# Patient Record
Sex: Male | Born: 1955 | Race: White | Hispanic: No | Marital: Married
Health system: Southern US, Community
[De-identification: ages and names within clinical notes are randomized; demographics above are authoritative.]

## PROBLEM LIST (undated history)

## (undated) DIAGNOSIS — E119 Type 2 diabetes mellitus without complications: Secondary | ICD-10-CM

## (undated) DIAGNOSIS — E785 Hyperlipidemia, unspecified: Secondary | ICD-10-CM

## (undated) DIAGNOSIS — D649 Anemia, unspecified: Secondary | ICD-10-CM

## (undated) DIAGNOSIS — N2581 Secondary hyperparathyroidism of renal origin: Secondary | ICD-10-CM

## (undated) DIAGNOSIS — I10 Essential (primary) hypertension: Secondary | ICD-10-CM

## (undated) DIAGNOSIS — G4733 Obstructive sleep apnea (adult) (pediatric): Secondary | ICD-10-CM

## (undated) DIAGNOSIS — N189 Chronic kidney disease, unspecified: Secondary | ICD-10-CM

## (undated) HISTORY — DX: Anemia, unspecified: D64.9

## (undated) HISTORY — DX: Obstructive sleep apnea (adult) (pediatric): G47.33

## (undated) HISTORY — DX: Essential (primary) hypertension: I10

## (undated) HISTORY — DX: Type 2 diabetes mellitus without complications: E11.9

## (undated) HISTORY — PX: ANKLE SURGERY: SHX546

## (undated) HISTORY — DX: Chronic kidney disease, unspecified: N18.9

## (undated) HISTORY — PX: CATARACT EXTRACTION: SUR2

## (undated) HISTORY — DX: Secondary hyperparathyroidism of renal origin: N25.81

## (undated) HISTORY — DX: Hyperlipidemia, unspecified: E78.5

## (undated) HISTORY — PX: EYE SURGERY: SHX253

---

## 2008-03-05 HISTORY — PX: TIBIA FRACTURE SURGERY: SHX806

## 2015-04-09 ENCOUNTER — Other Ambulatory Visit: Payer: Self-pay

## 2015-04-09 ENCOUNTER — Encounter: Payer: Self-pay | Admitting: Vascular Surgery

## 2015-04-09 DIAGNOSIS — N184 Chronic kidney disease, stage 4 (severe): Secondary | ICD-10-CM

## 2015-04-09 DIAGNOSIS — Z01810 Encounter for preprocedural cardiovascular examination: Secondary | ICD-10-CM

## 2015-04-16 ENCOUNTER — Inpatient Hospital Stay (HOSPITAL_COMMUNITY)
Admission: RE | Admit: 2015-04-16 | Discharge: 2015-04-16 | Disposition: A | Discharge status: Home / Self Care | Payer: 59 | Source: Ambulatory Visit

## 2015-04-16 ENCOUNTER — Ambulatory Visit: Payer: 59 | Admitting: Vascular Surgery

## 2015-04-16 DIAGNOSIS — N184 Chronic kidney disease, stage 4 (severe): Secondary | ICD-10-CM

## 2015-04-16 DIAGNOSIS — Z01810 Encounter for preprocedural cardiovascular examination: Secondary | ICD-10-CM

## 2015-10-25 ENCOUNTER — Other Ambulatory Visit: Payer: Self-pay | Admitting: Physical Medicine and Rehabilitation

## 2015-10-25 DIAGNOSIS — M5416 Radiculopathy, lumbar region: Secondary | ICD-10-CM

## 2015-10-28 ENCOUNTER — Other Ambulatory Visit: Payer: Self-pay | Admitting: Physical Medicine and Rehabilitation

## 2015-10-28 ENCOUNTER — Ambulatory Visit
Admission: RE | Admit: 2015-10-28 | Discharge: 2015-10-28 | Disposition: A | Discharge status: Home / Self Care | Payer: Medicare Other | Source: Ambulatory Visit | Attending: Physical Medicine and Rehabilitation | Admitting: Physical Medicine and Rehabilitation

## 2015-10-28 DIAGNOSIS — M5416 Radiculopathy, lumbar region: Secondary | ICD-10-CM

## 2015-10-28 IMAGING — XA DG EPIDURAL/NERVE ROOT
4 series · 4 of 4 positions shown · non-contrast
Comparison: none

CLINICAL DATA: Bilateral L5 pars defects. Low back pain. The
patient states the majority of his pain is in the upper back.
Occasional radicular symptoms.

[Series 1: ortho standard · 1 of 1 slices shown (1 of 4)]
[im 1/1]
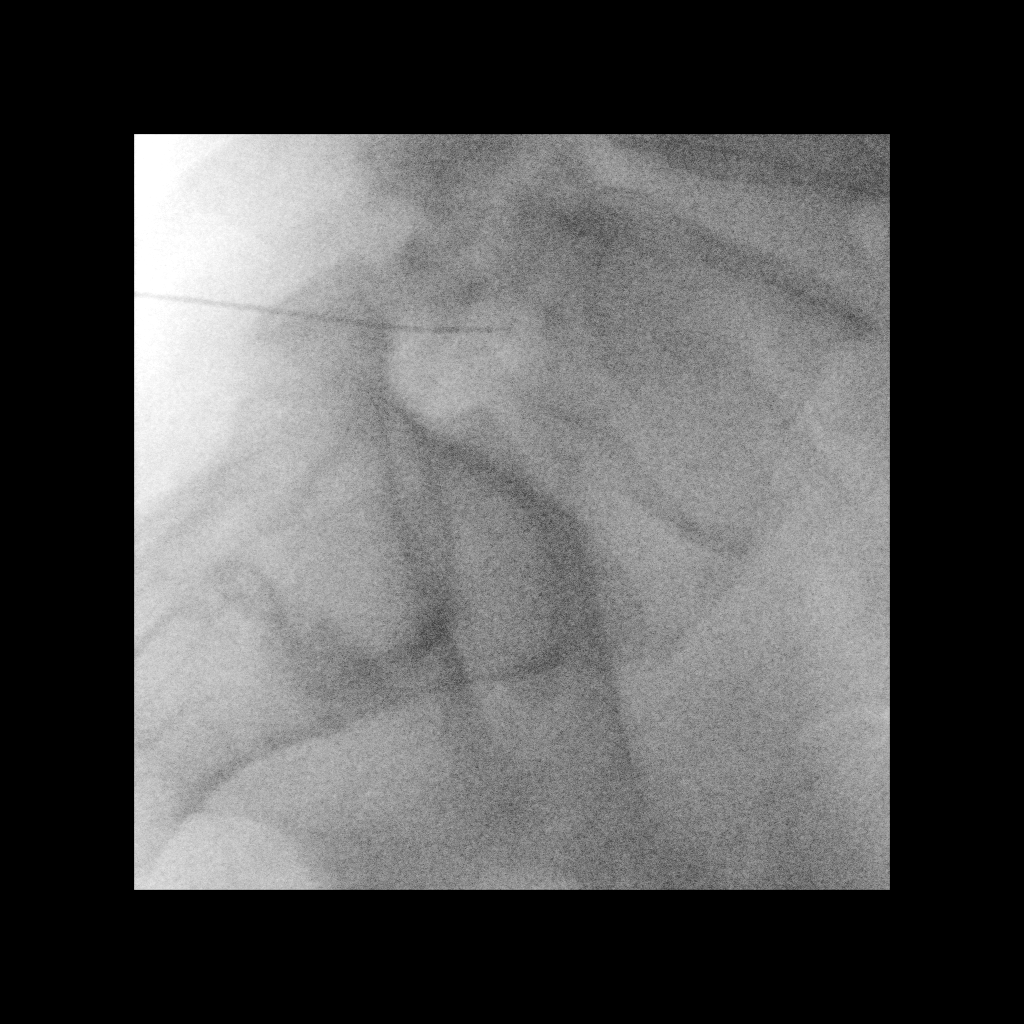

[Series 2: ortho standard · 1 of 1 slices shown (2 of 4)]
[im 1/1]
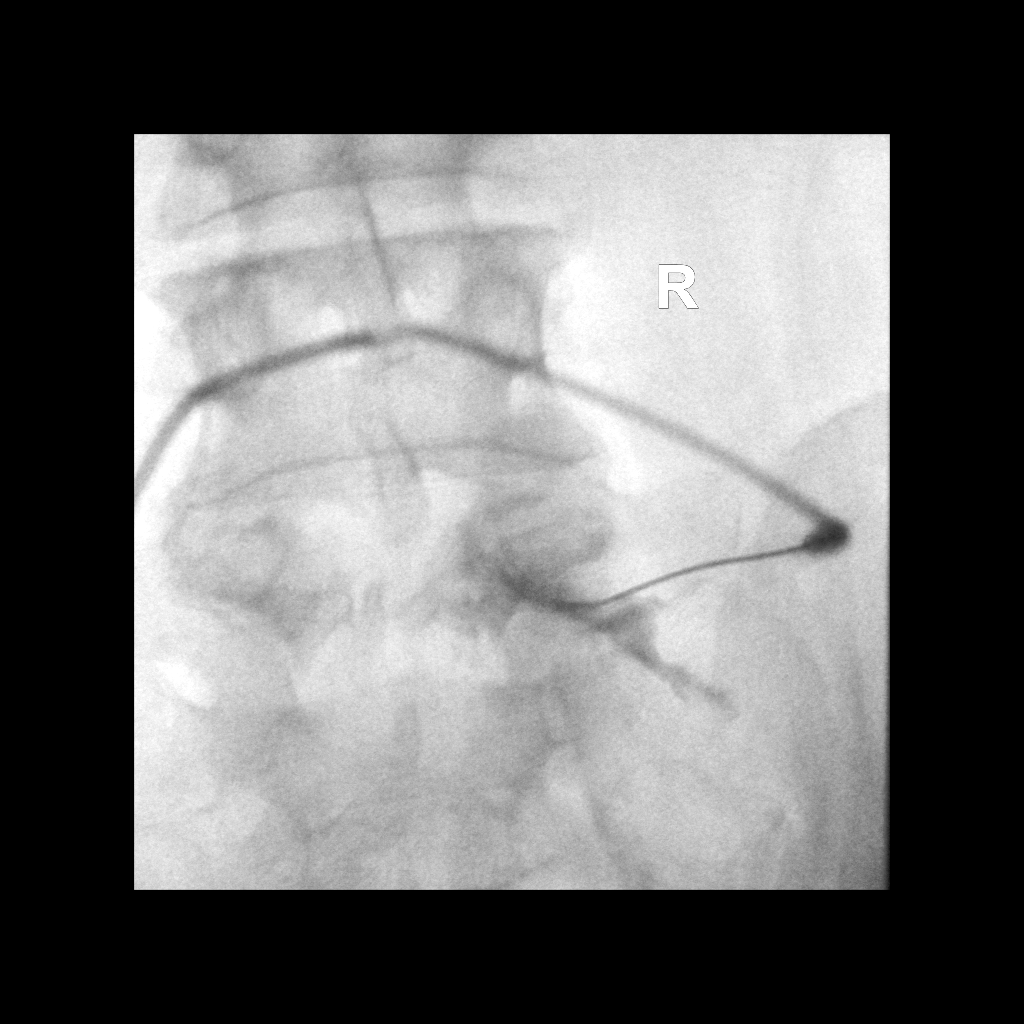

[Series 3: ortho standard · 1 of 1 slices shown (3 of 4)]
[im 1/1]
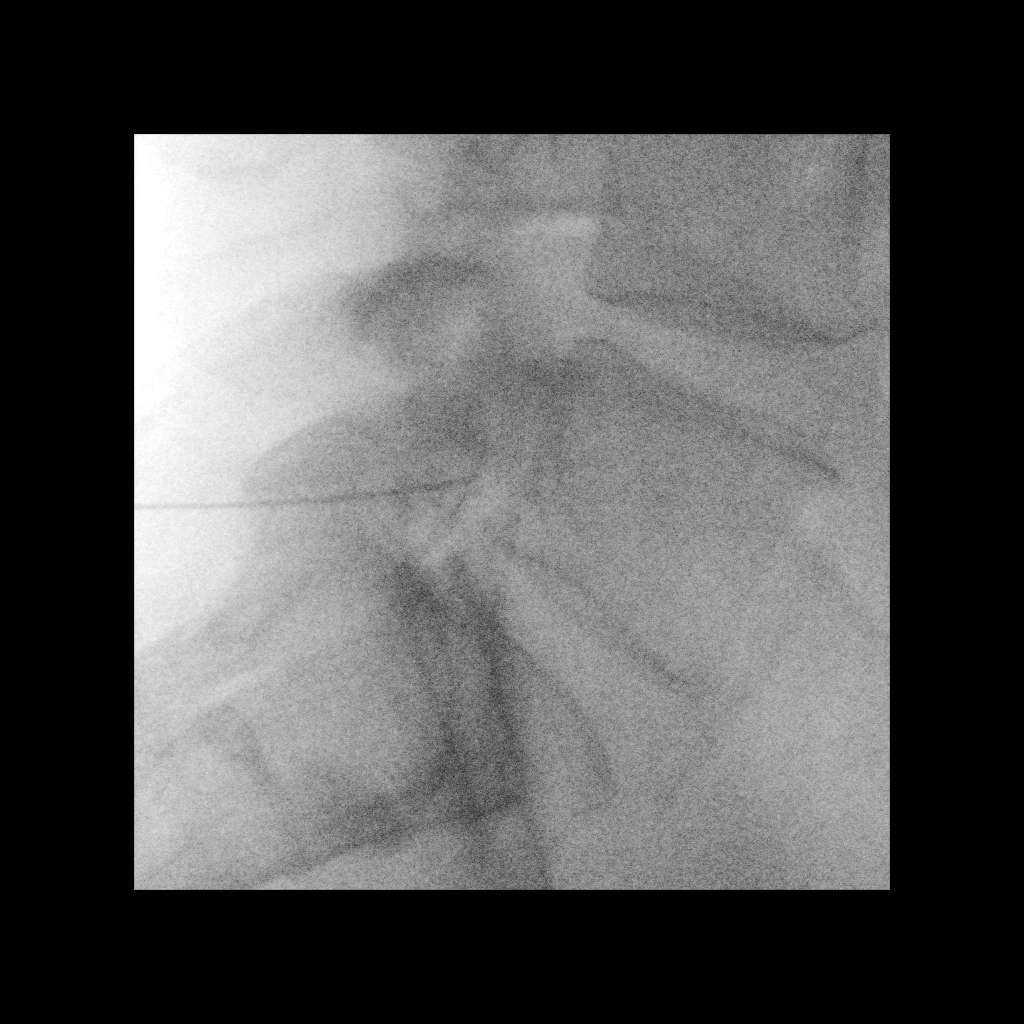

[Series 4: ortho standard · 1 of 1 slices shown (4 of 4)]
[im 1/1]
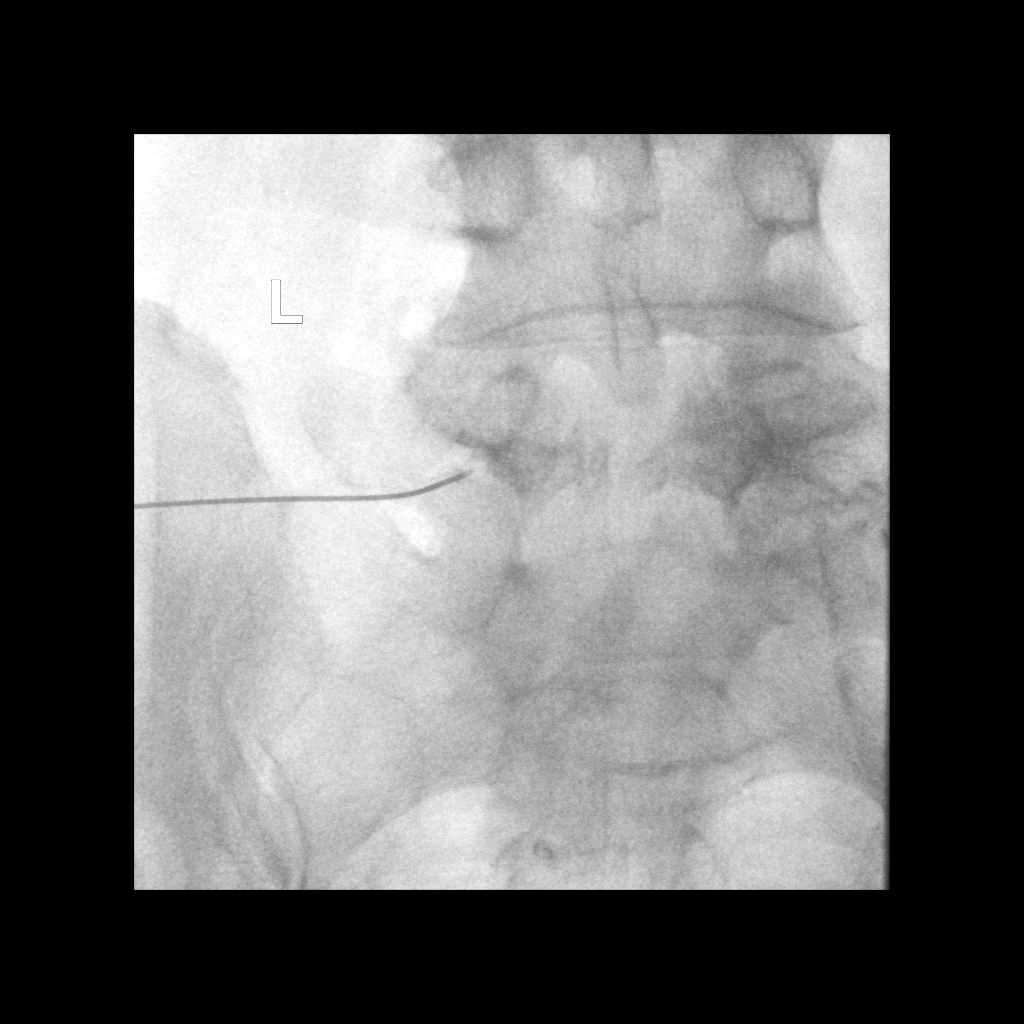

[4 of 4 positions shown; findings below may reference images not displayed]

FLUOROSCOPY TIME:  Radiation Exposure Index (as provided by the
fluoroscopic device): 107.16 uGy*m2

If the device does not provide the exposure index:

Fluoroscopy Time:  42 seconds

Number of Acquired Images:  0

PROCEDURE:
SELECTIVE NERVE ROOT AND TRANSFORAMINAL EPIDURAL STEROID INJECTION:

A transforaminal approach was performed on the right at the L5-S1
foramen. The overlying skin was cleansed and anesthetized. A 22
gauge spinal needle was advanced into the foramen from a lateral
oblique approach. Injection of 1 cc of Isovue M 200 outlined the
nerve root and extended into the epidural space. No vascular or
intrathecal spread is evident.

I then injected 60 mg of Depo-Medrol and 0.75 ml of 1% lidocaine.
The patient tolerated the procedure without evidence for
complication.

A transforaminal approach was performed on the left at the L5-S1
foramen. The overlying skin was cleansed and anesthetized. A 22
gauge spinal needle was advanced into the foramen from a lateral
oblique approach. Injection of 1 cc of Isovue M 200 outlined the
nerve root and extended into the epidural space. No vascular or
intrathecal spread is evident.

I then injected 60 mg of Depo-Medrol and 0.75 ml of 1% lidocaine.
The patient tolerated the procedure without evidence for
complication.

The patient was observed for 20 minutes prior to discharge in stable
neurologic condition.
IMPRESSION: Technically successful selective nerve root block and transforaminal
epidural steroid injection bilaterally at L5-S1.

## 2015-10-28 MED ORDER — METHYLPREDNISOLONE ACETATE 40 MG/ML INJ SUSP (RADIOLOG
120.0000 mg | Freq: Once | INTRAMUSCULAR | Status: AC
  Administered 2015-10-28: 120 mg via EPIDURAL

## 2015-10-28 MED ORDER — IOPAMIDOL (ISOVUE-M 200) INJECTION 41%
1.0000 mL | Freq: Once | INTRAMUSCULAR | Status: AC
  Administered 2015-10-28: 1 mL via EPIDURAL

## 2015-10-28 NOTE — Discharge Instructions (Signed)

## 2017-03-05 DEATH — deceased
